# Patient Record
Sex: Female | Born: 1960 | Race: White | Hispanic: No | Smoking: Never smoker
Health system: Southern US, Community
[De-identification: ages and names within clinical notes are randomized; demographics above are authoritative.]

## PROBLEM LIST (undated history)

## (undated) HISTORY — PX: TONSILLECTOMY: SUR1361

## (undated) HISTORY — PX: APPENDECTOMY: SHX54

---

## 2004-02-29 ENCOUNTER — Emergency Department (HOSPITAL_COMMUNITY): Admission: EM | Admit: 2004-02-29 | Discharge: 2004-02-29 | Payer: Self-pay | Admitting: *Deleted

## 2005-11-27 ENCOUNTER — Ambulatory Visit (HOSPITAL_COMMUNITY): Admission: RE | Admit: 2005-11-27 | Discharge: 2005-11-27 | Payer: Self-pay | Admitting: Occupational Therapy

## 2007-01-12 ENCOUNTER — Emergency Department (HOSPITAL_COMMUNITY): Admission: EM | Admit: 2007-01-12 | Discharge: 2007-01-12 | Payer: Self-pay | Admitting: Emergency Medicine

## 2007-02-17 ENCOUNTER — Ambulatory Visit: Payer: Self-pay | Admitting: Orthopedic Surgery

## 2007-02-23 ENCOUNTER — Ambulatory Visit (HOSPITAL_COMMUNITY): Admission: RE | Admit: 2007-02-23 | Discharge: 2007-02-23 | Payer: Self-pay | Admitting: Orthopedic Surgery

## 2013-04-25 ENCOUNTER — Encounter: Payer: Self-pay | Admitting: Orthopedic Surgery

## 2013-05-07 ENCOUNTER — Encounter: Payer: Self-pay | Admitting: Orthopedic Surgery

## 2013-05-31 ENCOUNTER — Ambulatory Visit: Payer: Self-pay | Admitting: Orthopedic Surgery

## 2013-05-31 LAB — CBC WITH DIFFERENTIAL/PLATELET
Comment - H1-Com1: NORMAL
HCT: 41.9 % (ref 35.0–47.0)
HGB: 14 g/dL (ref 12.0–16.0)
MCH: 32.6 pg (ref 26.0–34.0)
MCHC: 33.5 g/dL (ref 32.0–36.0)
Monocytes: 9 %
Platelet: 348 10*3/uL (ref 150–440)
RBC: 4.31 10*6/uL (ref 3.80–5.20)
Segmented Neutrophils: 58 %
WBC: 8.7 10*3/uL (ref 3.6–11.0)

## 2013-06-06 ENCOUNTER — Encounter: Payer: Self-pay | Admitting: Orthopedic Surgery

## 2013-07-07 ENCOUNTER — Encounter: Payer: Self-pay | Admitting: Orthopedic Surgery

## 2013-08-07 ENCOUNTER — Encounter: Payer: Self-pay | Admitting: Orthopedic Surgery

## 2013-09-06 ENCOUNTER — Encounter: Payer: Self-pay | Admitting: Orthopedic Surgery

## 2013-11-17 ENCOUNTER — Ambulatory Visit: Payer: Self-pay | Admitting: Family Medicine

## 2014-01-18 ENCOUNTER — Encounter: Payer: Self-pay | Admitting: Orthopedic Surgery

## 2014-02-04 ENCOUNTER — Encounter: Payer: Self-pay | Admitting: Orthopedic Surgery

## 2014-06-25 ENCOUNTER — Encounter: Payer: Self-pay | Admitting: Orthopedic Surgery

## 2014-07-07 ENCOUNTER — Encounter: Payer: Self-pay | Admitting: Orthopedic Surgery

## 2014-08-07 ENCOUNTER — Encounter: Payer: Self-pay | Admitting: Orthopedic Surgery

## 2014-09-06 ENCOUNTER — Encounter: Payer: Self-pay | Admitting: Orthopedic Surgery

## 2015-02-24 IMAGING — CR RIGHT ELBOW - COMPLETE 3+ VIEW
1 series · 5 of 5 positions shown · non-contrast
Comparison: None.

CLINICAL DATA: Pain

EXAM:
RIGHT ELBOW - COMPLETE 3+ VIEW

[Series 1: oblique · 0.17mm/px · 5 of 5 slices shown]
[im 1/5]
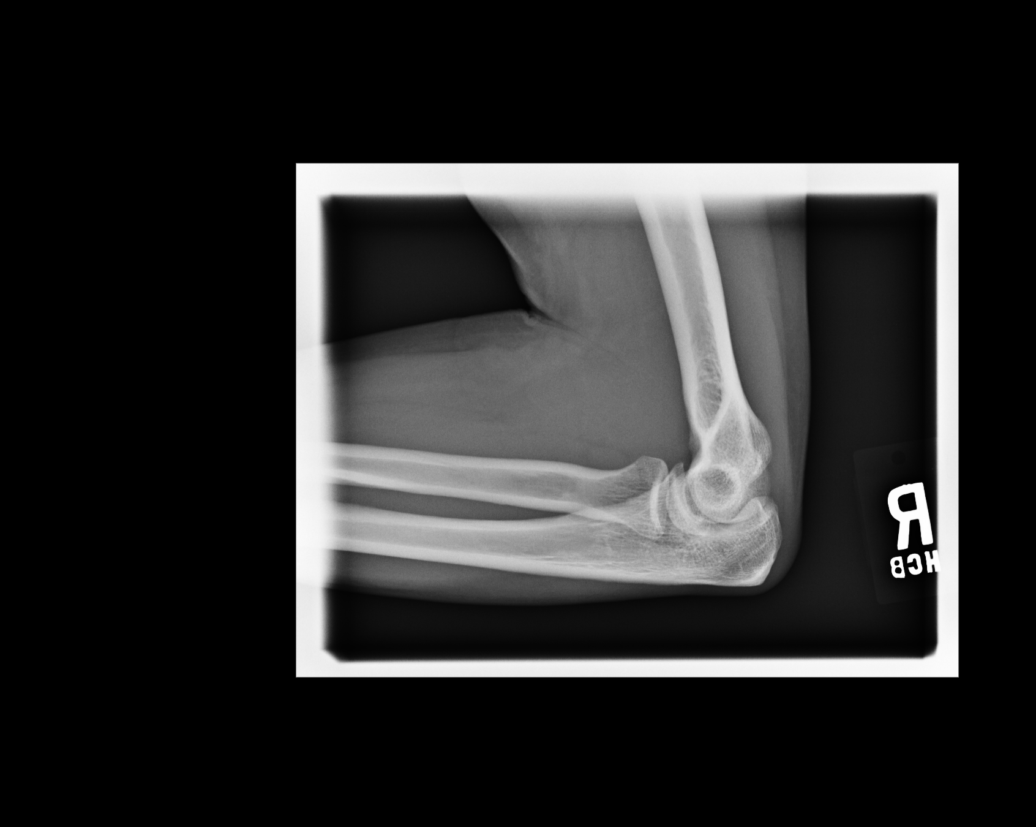
[im 2/5]
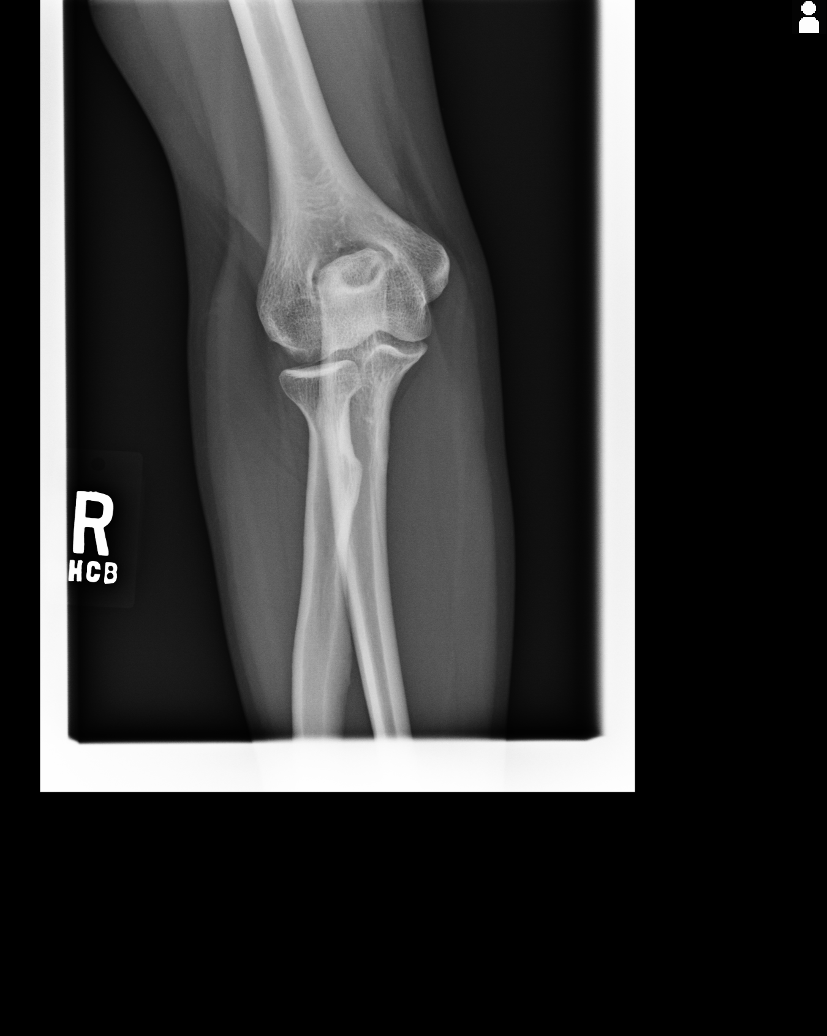
[im 3/5]
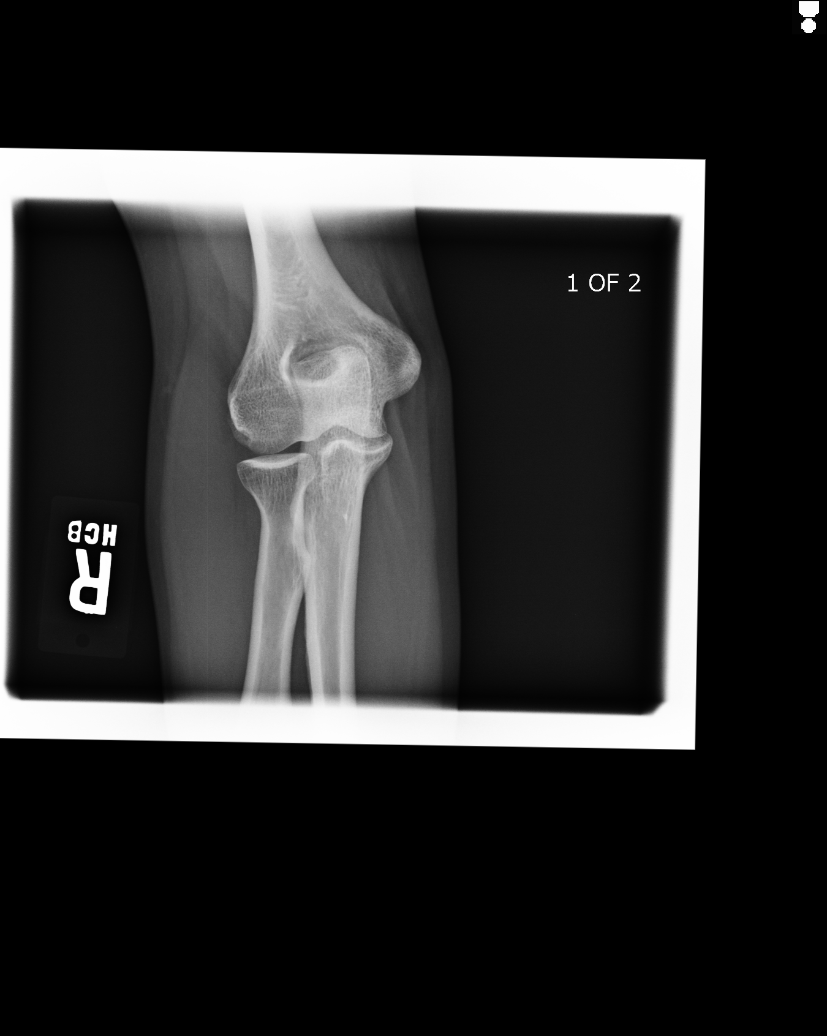
[im 4/5]
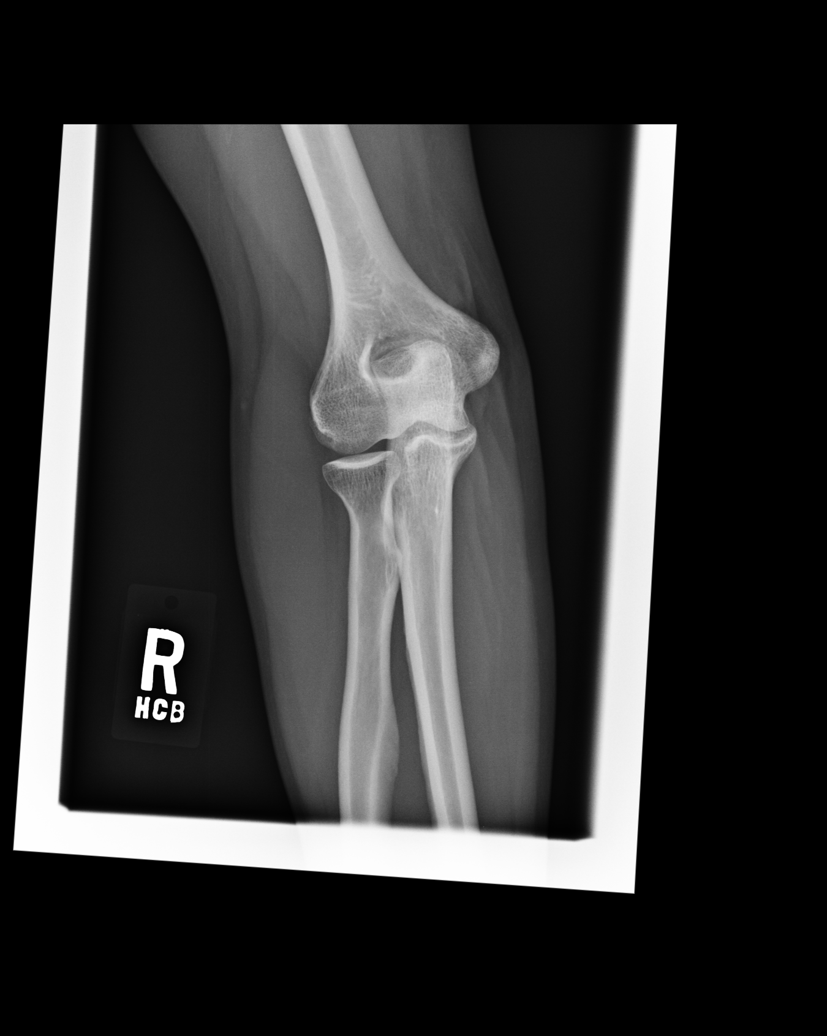
[im 5/5]
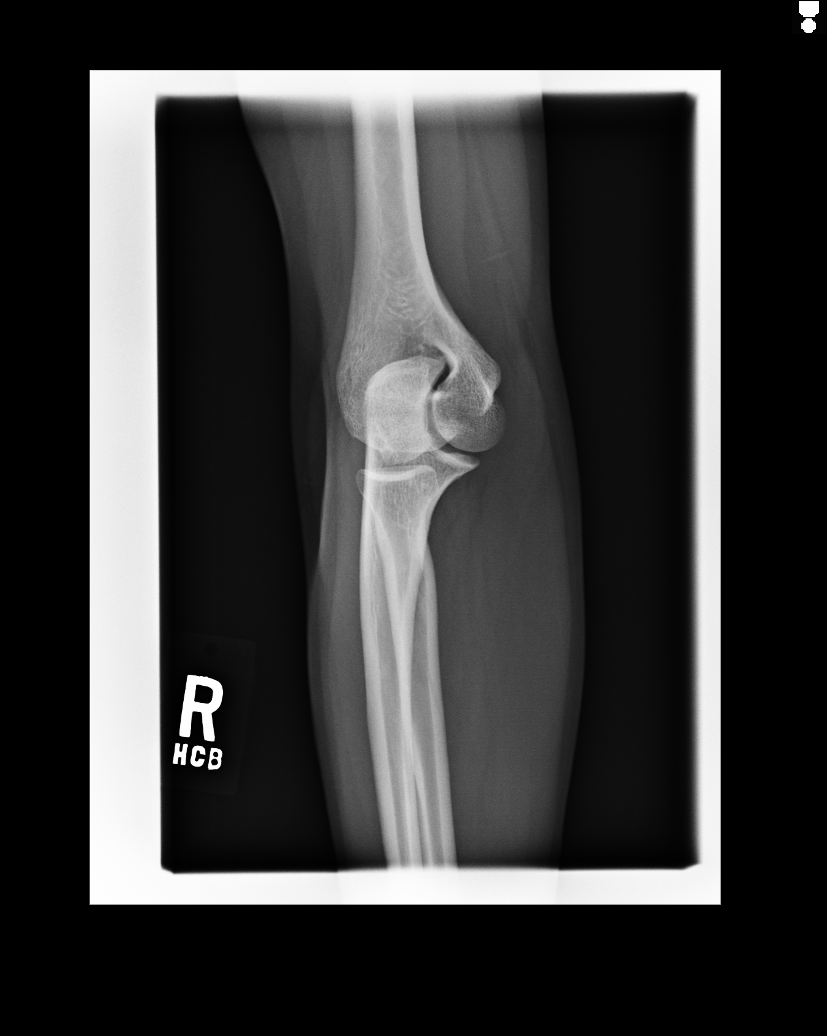

[5 of 5 positions shown; findings below may reference images not displayed]

FINDINGS: Frontal, lateral, and bilateral oblique views were obtained. There
is no fracture, dislocation, or effusion. Joint spaces appear
intact. No erosive change.
IMPRESSION: No abnormality noted.

## 2015-03-05 ENCOUNTER — Encounter: Admit: 2015-03-05 | Disposition: A | Discharge status: Home / Self Care | Payer: Self-pay

## 2015-03-08 ENCOUNTER — Encounter: Admit: 2015-03-08 | Disposition: A | Discharge status: Home / Self Care | Payer: Self-pay

## 2015-04-10 ENCOUNTER — Encounter: Payer: Self-pay | Admitting: Physical Therapy

## 2015-04-10 ENCOUNTER — Ambulatory Visit: Payer: Worker's Compensation | Attending: Neurology | Admitting: Physical Therapy

## 2015-04-10 DIAGNOSIS — M6281 Muscle weakness (generalized): Secondary | ICD-10-CM | POA: Diagnosis not present

## 2015-04-10 DIAGNOSIS — M545 Low back pain, unspecified: Secondary | ICD-10-CM

## 2015-04-10 NOTE — Therapy (Signed)
[place] [facility] [address] [place], [place], [address] Phone: [phone]   Fax:  [phone]  Physical Therapy Treatment  Patient Details  Name: Virginia Mckay MRN: [id number] Date of Birth: 1961/02/22 Referring Provider:  Wallace Going, MD  Encounter Date: 04/10/2015      PT End of Session - 04/10/15 1251    Visit Number 14   Number of Visits 21   Date for PT Re-Evaluation 05/30/15   PT Start Time 0821   PT Stop Time 0919   PT Time Calculation (min) 58 min   Activity Tolerance Patient tolerated treatment well;No increased pain;Patient limited by fatigue   Behavior During Therapy [facility] for tasks assessed/performed      No past medical history on file.  No past surgical history on file.  There were no vitals filed for this visit.  Visit Diagnosis:  Muscle weakness (generalized)  Midline low back pain without sciatica      Subjective Assessment - 04/10/15 1241    Subjective Pt. reports no pain but states she is deconditioned and easliy fatigued with increase activity.  Pt. has not participated with gym based ex. since last PT tx. session secondary to being busy with daily activities.  Pt. has returned to Dr. Gordy Clement with instruction to continue with PT and assigned tentative return to work date of 05/27/15.   Patient Stated Goals return to work   Currently in Pain? No/denies       OBJECTIVE:  There.ex.: see new HEP handouts/ reviewed B UE gym based Nautilus ex. Program.  Standing Nautilus: lat. Pull downs 50#/ tricep ext. 30#/ scap. Retraction 50#/ press-ups 30#/ chest press 30#  10x2 each ex. TG B knee flexion 30x/ single leg squats 20x each L/R and heel raises 20x each.  Scifit L8 12 min. B UE/LE (no charge).  Encouraged pt. To continue with aquatic ex. Program/ attend gym on a regular basis.      Pt response for medical necessity:  Noted generalized muscle fatigue, esp. In B UE/low back. Pt. Continues to progress with  strengthening and will benefit from continued skilled PT instruction with gym program focus.          PT Education - 04/10/15 1250    Education provided Yes   Education Details issued handout for gym based UE strengthening every other day.   Person(s) Educated Patient   Methods Explanation;Demonstration;Handout   Comprehension Verbalized understanding;Returned demonstration             PT Long Term Goals - 04/10/15 1533    PT LONG TERM GOAL #1   Title Pt. I with HEP to improve core strengthening to WNL to develop independent gym based/ aquatic ex. program.   Time 8   Period Weeks   Status Partially Met   PT LONG TERM GOAL #2   Title Pt. will decrease Oswestry to <30% for reduced self-reported lumbar spine disability.    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    Time 8   Period Weeks   Status On-going   PT LONG TERM GOAL #4   Title Pt. able to open aircraft window weighing up to 59# with no limitations safely.    Time 8   Period Weeks   Status On-going   PT LONG TERM GOAL #5  Title Pt. able to push/pull wheeled beverage cart up a 7.5 deg. incline with a force of 60# safely with no limitations to promote return to work.    Time 8   Status Partially Met               Plan - 04/10/15 1531    Pt will benefit from skilled therapeutic intervention in order to improve on the following deficits Improper body mechanics;Decreased strength;Decreased mobility;Pain;Decreased range of motion;Impaired UE functional use   PT Treatment/Interventions Aquatic Therapy;Functional mobility training;Patient/family education;Cryotherapy;Manual techniques;Therapeutic exercise   PT Next Visit Plan generalized strength training/ job demand lifting and pull requirements.     Consulted and Agree with Plan of Care Patient       Pura Spice, PT, DPT #  [id number]  Pura Spice 04/10/2015, 3:46 PM  [place] [facility] [address] [place], [place], [address] Phone: [phone]   Fax:  [phone]

## 2015-04-10 NOTE — Patient Instructions (Signed)
Issued HEP with resist shld lat pull downs/ resist elbow ext bil/ resist elbow flx/ resist shld retraction close elbow/ resist shld overhead press-ups/ resist chest press with horizontal focus/ knee squat with TG (all gym focused ex).

## 2015-04-17 ENCOUNTER — Ambulatory Visit: Payer: Self-pay | Admitting: Physical Therapy

## 2015-04-17 ENCOUNTER — Ambulatory Visit: Payer: Worker's Compensation | Admitting: Physical Therapy

## 2015-04-17 ENCOUNTER — Encounter: Payer: Self-pay | Admitting: Physical Therapy

## 2015-04-17 DIAGNOSIS — M6281 Muscle weakness (generalized): Secondary | ICD-10-CM

## 2015-04-17 DIAGNOSIS — M545 Low back pain, unspecified: Secondary | ICD-10-CM

## 2015-04-17 NOTE — Therapy (Signed)
[facility] [address] [place], [place], [address] Phone: [phone]   Fax:  [phone]  Physical Therapy Treatment  Patient Details  Name: Virginia Mckay MRN: [id number] Date of Birth: 11-Jul-1961 Referring Provider:  Wallace Going, MD  Encounter Date: 04/17/2015      PT End of Session - 04/17/15 0927    Visit Number 15   Number of Visits 21   Date for PT Re-Evaluation 05/30/15   PT Start Time 0813   PT Stop Time 0922   PT Time Calculation (min) 69 min   Activity Tolerance Patient tolerated treatment well;No increased pain;Patient limited by fatigue   Behavior During Therapy [facility] for tasks assessed/performed      History reviewed. No pertinent past medical history.  History reviewed. No pertinent past surgical history.  There were no vitals filed for this visit.  Visit Diagnosis:  Muscle weakness (generalized)  Midline low back pain without sciatica      Subjective Assessment - 04/17/15 0924    Subjective Pt. states she has participated with pool ex. program 4x since last PT visit.  Pt. reports improvement in overall endurance since starting pool and states she is very motivated to get her strength back/ return to work.  Pt. states R shoulder discomfort for the past few days and crepitus noted.    Patient Stated Goals return to work   Currently in Pain? No/denies       OBJECTIVE: Ther.ex.: Discussed Pool ex. Program. Standing Nautilus: lat. Pull downs 60#/ tricep ext. 40#/ scap. retraction 50#/ chest press 40# 10x2 each ex (increase 10# resistance with all Nautilus ex.)- good core muscle control/ upright posture.  TG B knee flexion 30x/ single leg squats 15x2x each L/R.  Waist to overhead repetitive lifting 20# box 6x (independent with minimal instruct/ cuing required).  Sled push/pull 120# 45 feet x 3. Scifit L7.5 15 min. B UE/LE (no charge).     Pt response for medical necessity: Generalized muscle  fatigue.  Pt. Continues to progress with strengthening and will benefit from continued skilled PT instruction with gym program focus.            PT Education - 04/17/15 0926    Education provided Yes   Education Details overhead lifting mechanics   Person(s) Educated Patient   Methods Explanation;Demonstration   Comprehension Verbalized understanding;Returned demonstration             PT Long Term Goals - 04/10/15 1533    PT LONG TERM GOAL #1   Title Pt. I with HEP to improve core strengthening to WNL to develop independent gym based/ aquatic ex. program.   Time 8   Period Weeks   Status Partially Met   PT LONG TERM GOAL #2   Title Pt. will decrease Oswestry to <30% for reduced self-reported lumbar spine disability.    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    Time 8   Period Weeks   Status On-going   PT LONG TERM GOAL #4   Title Pt. able to open aircraft window weighing up to 59# with no limitations safely.    Time 8   Period Weeks   Status On-going   PT LONG TERM GOAL #5   Title Pt. able to push/pull wheeled beverage cart up a 7.5  deg. incline with a force of 60# safely with no limitations to promote return to work.    Time 8   Status Partially Met               Plan - 04/17/15 0927    Clinical Impression Statement Marked increase in UE resisted strengthening on Nautlilus today (10# increase in all movements).  Pt. able to lift 20# box 6x from waist to overhead and push 60# sled over 100 feet with proper technique.    Pt will benefit from skilled therapeutic intervention in order to improve on the following deficits Improper body mechanics;Decreased strength;Decreased mobility;Pain;Decreased range of motion;Impaired UE functional use   Rehab Potential Good   PT Frequency 1x / week   PT Duration 8 weeks   PT Treatment/Interventions  Aquatic Therapy;Functional mobility training;Patient/family education;Cryotherapy;Manual techniques;Therapeutic exercise   PT Next Visit Plan generalized strength training/ job demand lifting and pull requirements.     Consulted and Agree with Plan of Care Patient        Problem List There are no active problems to display for this patient.  Pura Spice, PT, DPT # [phone]  04/17/2015, 9:30 AM  [place] [facility] [address] [place], [place], [address] Phone: [phone]   Fax:  [phone]

## 2015-04-24 ENCOUNTER — Encounter: Payer: Self-pay | Admitting: Physical Therapy

## 2015-04-24 ENCOUNTER — Ambulatory Visit: Payer: Worker's Compensation | Admitting: Physical Therapy

## 2015-04-24 DIAGNOSIS — M545 Low back pain, unspecified: Secondary | ICD-10-CM

## 2015-04-24 DIAGNOSIS — M6281 Muscle weakness (generalized): Secondary | ICD-10-CM

## 2015-04-24 NOTE — Therapy (Signed)
[facility] [address] [place], [place], [address] Phone: [phone]   Fax:  [phone]  Physical Therapy Treatment  Patient Details  Name: Virginia Mckay MRN: [id number] Date of Birth: 09-May-1961 Referring Provider:  Wallace Going, MD  Encounter Date: 04/24/2015      PT End of Session - 04/24/15 1002    Visit Number 16   Number of Visits 21   Date for PT Re-Evaluation 05/30/15   PT Start Time 0811   PT Stop Time 0910   PT Time Calculation (min) 59 min   Activity Tolerance Patient tolerated treatment well;No increased pain;Patient limited by fatigue   Behavior During Therapy [facility] for tasks assessed/performed      No past medical history on file.  No past surgical history on file.  There were no vitals filed for this visit.  Visit Diagnosis:  Muscle weakness (generalized)  Midline low back pain without sciatica      Subjective Assessment - 04/24/15 0824    Subjective Pt. states she is doing better and getting closer to returning to work.  Pt. went to [place] recently to get finger printing/ start process to return to work.  Pt. goes to [place] tomorrow for passport authorization tomorrow.     Limitations Standing;Walking;Other (comment)   Patient Stated Goals return to work   Currently in Pain? No/denies      - discuss swimming movements in pool.  Pt. Reports difficulty with R shoulder during freestyle mvmt. As compared to L shoulder.  Numbness reported in R UE/hand during swimming.  Pt. Has MD f/u with Dr. Gordy Clement on June 2nd to discuss/ get released to return to work.    QuickDASH: 45.5%     LEFS: 45 out of 80.     OBJECTIVE: Ther.ex.:  Scifit L7.5 15+ min. B UE/LE (no charge). Standing Nautilus: lat. Pull downs 60#/ unilateral scap. Retraction 60#/ tricep ext. 40#/ bilateral scap. Retraction with handles 60#/ chest press with wand 40# 25x each ex. - good core muscle control/ upright posture (no verbal cuing required). TG  (20#) B knee flexion 30x with shoulder flexion/ single leg squats 15x2 each L/R. Waist to overhead repetitive lifting 25# box 1x (independent with minimal instruct/ cuing required). Sled push/pull 120# 45 feet x 3.         Pt response for medical necessity: Generalized muscle fatigue. No increase c/o pain and marked improvement noted with squats/ UE strengthening.  .          PT Education - 04/24/15 1001    Education provided Yes   Education Details gym based ex. program.   Person(s) Educated Patient   Methods Explanation;Demonstration   Comprehension Verbalized understanding;Returned demonstration           PT Long Term Goals - 04/10/15 1533    PT LONG TERM GOAL #1   Title Pt. I with HEP to improve core strengthening to WNL to develop independent gym based/ aquatic ex. program.   Time 8   Period Weeks   Status Partially Met   PT LONG TERM GOAL #2   Title Pt. will decrease Oswestry to <30% for reduced self-reported lumbar spine disability.    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    Time 8   Period Weeks   Status On-going  PT LONG TERM GOAL #4   Title Pt. able to open aircraft window weighing up to 59# with no limitations safely.    Time 8   Period Weeks   Status On-going   PT LONG TERM GOAL #5   Title Pt. able to push/pull wheeled beverage cart up a 7.5 deg. incline with a force of 60# safely with no limitations to promote return to work.    Time 8   Status Partially Met       Problem List There are no active problems to display for this patient.  Pura Spice, PT, DPT # [phone]   04/24/2015, 10:04 AM  [place] [facility] [address] [place], [place], [address] Phone: [phone]   Fax:  [phone]

## 2015-05-01 ENCOUNTER — Encounter: Payer: Self-pay | Admitting: Physical Therapy

## 2015-05-01 ENCOUNTER — Ambulatory Visit: Payer: Worker's Compensation | Admitting: Physical Therapy

## 2015-05-01 DIAGNOSIS — M545 Low back pain, unspecified: Secondary | ICD-10-CM

## 2015-05-01 DIAGNOSIS — M6281 Muscle weakness (generalized): Secondary | ICD-10-CM

## 2015-05-01 NOTE — Therapy (Signed)
[place] [address] [place], [place], [address] Phone: [phone]   Fax:  [phone]  Physical Therapy Treatment  Patient Details  Name: Virginia Mckay MRN: [id number] Date of Birth: [birth year] Referring Provider:  Wallace Going, MD  Encounter Date: 05/01/2015      PT End of Session - 05/01/15 1224    Visit Number 17   Number of Visits 21   Date for PT Re-Evaluation 05/30/15   PT Start Time 0818   PT Stop Time 0921   PT Time Calculation (min) 63 min   Activity Tolerance Patient tolerated treatment well;No increased pain;Patient limited by fatigue   Behavior During Therapy [facility] for tasks assessed/performed      No past medical history on file.  No past surgical history on file.  There were no vitals filed for this visit.  Visit Diagnosis:  Muscle weakness (generalized)  Midline low back pain without sciatica      Subjective Assessment - 05/01/15 0828    Subjective Pt. states L hand is completely numb and has same issues during freestyle movement in pool every morning.  Pt. excited about returning to work and feels she is ready.     Limitations Standing;Walking;Other (comment)   Patient Stated Goals return to work        OBJECTIVE: Ther.ex.:  B UBE 3.5 min. F/b (2 bars)- consistent cadence (fatigue noted).  TG (20#) B knee flexion 30x/ toe in/ toe out 15 each/ single leg squats 15x2 each L/R. Standing Nautilus (handles): lat. Pull downs 60# (seated)/ unilateral scap. Retraction 60#/ tricep ext. 40#/ 40# B sh. Adduction/ bilateral scap. Retraction with handles 60# 25x each.  Reverse BOSU squats 30x.  Seated green exercise ball 2# UE ex. Scifit L9 15+ min. B UE/LE (no charge).     Pt response for medical necessity: Generalized muscle fatigue. No increase c/o pain and marked improvement noted with squats/ UE strengthening. .           PT Long Term Goals - 04/10/15 1533    PT LONG TERM GOAL  #1   Title Pt. I with HEP to improve core strengthening to WNL to develop independent gym based/ aquatic ex. program.   Time 8   Period Weeks   Status Partially Met   PT LONG TERM GOAL #2   Title Pt. will decrease Oswestry to <30% for reduced self-reported lumbar spine disability.    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    Time 8   Period Weeks   Status On-going   PT LONG TERM GOAL #4   Title Pt. able to open aircraft window weighing up to 59# with no limitations safely.    Time 8   Period Weeks   Status On-going   PT LONG TERM GOAL #5   Title Pt. able to push/pull wheeled beverage cart up a 7.5 deg. incline with a force of 60# safely with no limitations to promote return to work.    Time 8   Status Partially Met            Plan - 05/01/15 1225    Clinical Impression Statement Good overall endurance with ther.ex. today with no rest breaks taken.  Good B UE AROM with overhead Nautilus resisted ex.  Great upright posture with seated  ball ex.     Pt will benefit from skilled therapeutic intervention in order to improve on the following deficits Improper body mechanics;Decreased strength;Decreased mobility;Pain;Decreased range of motion;Impaired UE functional use   Rehab Potential Good   PT Frequency 1x / week   PT Duration 8 weeks   PT Treatment/Interventions Aquatic Therapy;Functional mobility training;Patient/family education;Cryotherapy;Manual techniques;Therapeutic exercise   PT Next Visit Plan generalized strength training/ job demand lifting and pull requirements.     PT Home Exercise Plan continue with personal trainer at [employer] and aquatic ex.    Consulted and Agree with Plan of Care Patient        Problem List There are no active problems to display for this patient.   Pura Spice, PT, DPT # [phone]   05/01/2015, 12:29 PM  [facility] [address] [place], [place], [address] Phone: [phone]   Fax:  [phone]

## 2015-05-08 ENCOUNTER — Ambulatory Visit: Payer: Worker's Compensation | Attending: Neurology

## 2015-05-08 ENCOUNTER — Encounter: Payer: Self-pay | Admitting: Physical Therapy

## 2015-05-08 DIAGNOSIS — M6281 Muscle weakness (generalized): Secondary | ICD-10-CM | POA: Diagnosis present

## 2015-05-08 DIAGNOSIS — M545 Low back pain, unspecified: Secondary | ICD-10-CM

## 2015-05-08 NOTE — Therapy (Signed)
[facility] [address] [place], [place], [address] Phone: [phone]   Fax:  [phone]  Physical Therapy Treatment  Patient Details  Name: Virginia Mckay MRN: [id number] Date of Birth: Sep 10, 1961 Referring Provider:  Wallace Going, MD  Encounter Date: 05/08/2015      PT End of Session - 05/08/15 1026    Visit Number 18   Number of Visits 21   Date for PT Re-Evaluation 05/30/15   PT Start Time 1000   PT Stop Time 1045   PT Time Calculation (min) 45 min   Activity Tolerance Patient tolerated treatment well;No increased pain;Patient limited by fatigue   Behavior During Therapy [facility] for tasks assessed/performed      History reviewed. No pertinent past medical history.  History reviewed. No pertinent past surgical history.  There were no vitals filed for this visit.  Visit Diagnosis:  Muscle weakness (generalized)  Midline low back pain without sciatica      Subjective Assessment - 05/08/15 1021    Subjective Pt reports that she is returning back to her physician tomorrow and is hoping he will release her to work. Pt is excited to return to work. She is still noting L hand numbness during swimming and reports persistent low back pain.    Limitations Standing;Walking   Patient Stated Goals return to work   Currently in Pain? Yes   Pain Score 6    Pain Location Back   Pain Orientation Lower       OBJECTIVE:  Extensive discussion with patient regarding plan of care, home exercise program, and return to work. Pt assisted in completing modified ODI. Goals updated with patient. Ther.ex.: Bilateral hand bike 4 min forward and backward (2 bars)- consistent cadence (fatigue noted). Standing Nautilus (bar): lat. pull downs 30# (standing) x 15, tricep press down 30# (standing) x 15, chest press 30# x 10, rows 30# x 10; Total gym squats x 15, single leg squats 2 x 10 bilateral; Generalized muscle fatigue.  No increase c/o pain. Pt  requires cues as well as weight adjustment to complete all exercises.                          PT Education - 05/08/15 1032    Education provided Yes   Education Details Reviewed goals, ensured independence with HEP   Person(s) Educated Patient   Methods Explanation   Comprehension Verbalized understanding             PT Long Term Goals - 05/08/15 1007    PT LONG TERM GOAL #1   Title Pt. I with HEP to improve core strengthening to WNL to develop independent gym based/ aquatic ex. program.   Time 8   Period Weeks   Status Achieved  Pt verbally confirmed   PT LONG TERM GOAL #2   Title Pt. will decrease Oswestry to <30% for reduced self-reported lumbar spine disability.    Baseline 52% on 05/08/15   Time 8   Period Weeks   Status On-going  Clarification provided for questions that are not well understood   PT LONG TERM GOAL #3   Title Pt. able to open a hinged aircraft door weighing 90# safely to promote return to work.    Time 8   Period Weeks   Status Achieved  Based on prior session. Pt confirms she has met goal   PT LONG TERM GOAL #4   Title Pt. able  to open aircraft window weighing up to 59# with no limitations safely.    Time 8   Period Weeks   Status Achieved  Based on prior session. Pt confirms she has met goal   PT LONG TERM GOAL #5   Title Pt. able to push/pull wheeled beverage cart up a 7.5 deg. incline with a force of 60# safely with no limitations to promote return to work.    Time 8   Period Weeks   Status Achieved  Based on prior session. Pt confirms she has met goal               Plan - 05/08/15 1033    Clinical Impression Statement Pt demonstrates ability to complete all exercises as instructed during today's session. She verbally confirms having met all of her goals with the exception of scoring a 52% (goal is <30%) on the modified ODI. Pt is ready and eager to return to work at this time. She is hoping that her physician  will release her at her follow-up appointment tomorrow. Pt confirms full independence with home exercise program and has no further deficits which need to be addressed by physical therapy.   Pt will benefit from skilled therapeutic intervention in order to improve on the following deficits --   Rehab Potential --   PT Frequency --   PT Duration --   PT Treatment/Interventions --   PT Next Visit Plan discharge today   PT Home Exercise Plan continue with personal trainer at [employer] and aquatic ex.    Consulted and Agree with Plan of Care Patient     PHYSICAL THERAPY DISCHARGE SUMMARY  Visits from Start of Care: 18  Current functional level related to goals / functional outcomes: All goals met with the exception of modified ODI: 52%. See above goals.    Remaining deficits: Modified ODI: 52%, low back pain   Education / Equipment: HEP, continue independent exercise routine at local gym/pool Plan: Patient agrees to discharge.  Patient goals were met. Patient is being discharged due to meeting the stated rehab goals.  Pt is pleased with her current functional level. ?????         Thank you for this referral. Please call with any questions or concerns.   Problem List There are no active problems to display for this patient.    Phillips Grout PT, DPT   Virginia Mckay 05/08/2015, 12:38 PM  [facility] [address] [place], [place], [address] Phone: [phone]   Fax:  [phone]

## 2015-07-19 ENCOUNTER — Other Ambulatory Visit: Payer: Self-pay | Admitting: Family Medicine

## 2015-07-19 DIAGNOSIS — R928 Other abnormal and inconclusive findings on diagnostic imaging of breast: Secondary | ICD-10-CM

## 2015-09-07 ENCOUNTER — Ambulatory Visit
Admission: EM | Admit: 2015-09-07 | Discharge: 2015-09-07 | Disposition: A | Discharge status: Home / Self Care | Payer: Worker's Compensation | Attending: Internal Medicine | Admitting: Internal Medicine

## 2015-09-07 DIAGNOSIS — L509 Urticaria, unspecified: Secondary | ICD-10-CM

## 2015-09-07 MED ORDER — CETIRIZINE HCL 10 MG PO TABS: 10.0000 mg | ORAL_TABLET | Freq: Every day | ORAL | Status: DC

## 2015-09-07 MED ORDER — PREDNISONE 20 MG PO TABS: 20.0000 mg | ORAL_TABLET | Freq: Every day | ORAL | Status: DC

## 2015-09-07 NOTE — ED Provider Notes (Signed)
CSN: [id number]     Arrival date & time 09/07/15  1338 History   First MD Initiated Contact with Patient 09/07/15 1456     Chief Complaint  Patient presents with  . Pruritis   HPI  Patient is a 54 year old lady with past medical history notable for allergy and urticaria. 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 She had difficulty with cough and nausea, but did not report vomiting, dyspnea, or wheezing. No syncope. She is followed by some [facility] specialists, including a dermatologist who is now practicing in [place] for her allergic symptoms.  Past medical history: Allergies/urticaria Past surgical history: Shoulder surgery Family history: Father is diabetic Social History works for [employer] as a Psychologist, sport and exercise Topics  . Smoking status: Never Smoker   . Smokeless tobacco: None  . Alcohol Use: Yes     Comment: rarely    Review of Systems  All other systems reviewed and are negative.   Allergies  Sulfa antibiotics; Iodine; Lyrica; Neosporin; and Neurontin  Home Medications   Prior to Admission medications   Medication Sig Start Date End Date Taking? Authorizing Provider  acyclovir (ZOVIRAX) 800 MG tablet Take 800 mg by mouth 5 (five) times daily.    Historical Provider, MD  cetirizine (ZYRTEC) 10 MG tablet Take 1 tablet (10 mg total) by mouth daily. 09/07/15   Eustace Moore, MD  diazepam (VALIUM) 5 MG tablet Take 5 mg by mouth every 6 (six) hours as needed for anxiety.    Historical Provider, MD  predniSONE (DELTASONE) 20 MG tablet Take 1 tablet (20 mg total) by mouth daily. 3 tabs qd x 3d then 2 tabs qd x 3d then 1 tab qd x 3d then 0.5 tab qd x 4d then stop. 09/07/15   Eustace Moore, MD  promethazine (PHENERGAN) 12.5 MG tablet Take 12.5 mg by mouth every 6 (six) hours as needed for nausea or vomiting.    Historical Provider, MD  SUMAtriptan (IMITREX) 100 MG  tablet Take 100 mg by mouth every 2 (two) hours as needed for migraine. May repeat in 2 hours if headache persists or recurs.    Historical Provider, MD  topiramate (TOPAMAX) 100 MG tablet Take 100 mg by mouth 2 (two) times daily.    Historical Provider, MD   Meds Ordered and Administered this Visit  Medications - No data to display  BP 105/74 mmHg  Pulse 61  Temp(Src) 97.9 F (36.6 C) (Oral)  Ht  (1.6 m)  Wt 128 lb (58.06 kg)  BMI 22.68 kg/m2  SpO2 100%  LMP 08/08/2015 No data found.   Physical Exam  Constitutional: She is oriented to person, place, and time. No distress.  Alert, nicely groomed  HENT:  Head: Atraumatic.  Eyes:  Conjugate gaze, no eye redness/drainage  Neck: Neck supple.  Cardiovascular: Normal rate and regular rhythm.   Pulmonary/Chest: No respiratory distress. She has no wheezes. She has no rales.  Lungs clear, symmetric breath sounds  Abdominal: She exhibits no distension.  Musculoskeletal: Normal range of motion.  No leg swelling  Neurological: She is alert and oriented to person, place, and time.  Skin: Skin is warm and dry.  No cyanosis Diffuse patchy erythema over both arms and anterior chest, patient relates that she's beginning to have some itching between her legs as well. No facial swelling evident today.  Nursing note and  vitals reviewed.   ED Course  Procedures (including critical care time)    MDM   1. Urticaria    Patient is concerned that the trigger for this episode of urticaria was getting new uniform, particularly to pink shirts date of 100% cotton.  She discussed washing the shirts before wearing again a couple of times, to see if fabric additives/sizing are contributing. If washing the shirts allows her to wear them again without triggering urticaria, then I think it is fine for her to return to work full duty.  Otherwise she will need to wear alternate clothing pending further evaluation.. Recommendation to follow-up with  dermatology if symptoms have not subsided in 2 weeks.  Prescriptions for prednisone taper and Zyrtec were printed. Medical status questionnaire was completed, and 2 copies were given to the patient. One copies for the patient, one is for her to deliver to HR.   Eustace Moore, MD 09/07/15 [phone]

## 2015-09-07 NOTE — ED Notes (Signed)
Itching all over upper body since the 27th of September because of new uniform given out by [employer].

## 2015-09-07 NOTE — Discharge Instructions (Signed)
Prescriptions for cetirizine (zyrtec, an antihistamine) and prednisone (steroid) taper were printed. Medical Status Questionnaire completed. Follow up dermatology to guide further management if symptoms do not subside in 2 weeks.

## 2015-09-12 ENCOUNTER — Encounter: Payer: Self-pay | Admitting: Emergency Medicine

## 2015-09-12 ENCOUNTER — Ambulatory Visit
Admission: EM | Admit: 2015-09-12 | Discharge: 2015-09-12 | Disposition: A | Discharge status: Home / Self Care | Payer: Worker's Compensation | Attending: Family Medicine | Admitting: Family Medicine

## 2015-09-12 DIAGNOSIS — L509 Urticaria, unspecified: Secondary | ICD-10-CM

## 2015-09-12 DIAGNOSIS — T50995D Adverse effect of other drugs, medicaments and biological substances, subsequent encounter: Secondary | ICD-10-CM

## 2015-09-12 MED ORDER — RANITIDINE HCL 150 MG PO CAPS: 150.0000 mg | ORAL_CAPSULE | Freq: Two times a day (BID) | ORAL | Status: AC

## 2015-09-12 MED ORDER — CETIRIZINE HCL 10 MG PO TABS: 10.0000 mg | ORAL_TABLET | Freq: Every day | ORAL | Status: AC

## 2015-09-12 MED ORDER — PREDNISONE 10 MG (21) PO TBPK: ORAL_TABLET | ORAL | Status: AC

## 2015-09-12 MED ORDER — METHYLPREDNISOLONE SODIUM SUCC 125 MG IJ SOLR
125.0000 mg | Freq: Once | INTRAMUSCULAR | Status: AC
  Administered 2015-09-12: 125 mg via INTRAMUSCULAR

## 2015-09-12 MED ORDER — LORATADINE 10 MG PO TABS: 10.0000 mg | ORAL_TABLET | Freq: Every day | ORAL | Status: AC

## 2015-09-12 NOTE — Discharge Instructions (Signed)
Hives °Hives are itchy, red, puffy (swollen) areas of the skin. Hives can change in size and location on your body. Hives can come and go for hours, days, or weeks. Hives do not spread from person to person (noncontagious). Scratching, exercise, and stress can make your hives worse. °HOME CARE °· Avoid things that cause your hives (triggers). °· Take antihistamine medicines as told by your doctor. Do not drive while taking an antihistamine. °· Take any other medicines for itching as told by your doctor. °· Wear loose-fitting clothing. °· Keep all doctor visits as told. °GET HELP RIGHT AWAY IF:  °· You have a fever. °· Your tongue or lips are puffy. °· You have trouble breathing or swallowing. °· You feel tightness in the throat or chest. °· You have belly (abdominal) pain. °· You have lasting or severe itching that is not helped by medicine. °· You have painful or puffy joints. °These problems may be the first sign of a life-threatening allergic reaction. Call your local emergency services (911 in [place] °MAKE SURE YOU:  °· Understand these instructions. °· Will watch your condition. °· Will get help right away if you are not doing well or get worse. °  °This information is not intended to replace advice given to you by your health care provider. Make sure you discuss any questions you have with your health care provider. °  °Document Released: 09/01/2008 Document Revised: 05/24/2012 Document Reviewed: 02/16/2012 °Elsevier Interactive Patient Education ©2016 [employer] ° °

## 2015-09-12 NOTE — ED Provider Notes (Signed)
CSN: [id number]     Arrival date & time 09/12/15  0917 History   First MD Initiated Contact with Patient 09/12/15 0945     Chief Complaint  Patient presents with  . Urticaria   (Consider location/radiation/quality/duration/timing/severity/associated sxs/prior Treatment) Patient is a 54 y.o. female presenting with urticaria. No language interpreter was used.  Urticaria This is a recurrent problem. The current episode started more than 1 week ago. The problem occurs every several days. The problem has been rapidly improving. Pertinent negatives include no chest pain, no abdominal pain, no headaches and no shortness of breath. Nothing aggravates the symptoms. Nothing relieves the symptoms. Treatments tried: Zyrtec and no other 6 day prednisone taper. The treatment provided mild relief.      Patient reports being's seen by Dr. Dayton Scrape on Saturday for allergic reaction. 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 By the time she got to [place] and took a nap her face was swollen and she felt miserable. She has numerous pictures showing the changes of her face her eyes etc. patient strongly believes that it is the the new uniforms that there were given that caused the problem.Marland Kitchen She was told that [employer] also had problems and that sometimes these new uniforms have formaldehyde or other chemicals to allow the pressing and folding to be done. 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 I have explained to her several times that I cannot say what caused her urticaria but what we can try to do is to treat it and get her referred to a dermatologist/or allergist for her to be evaluated. She states that the prednisone that was given to her by Dr. Dayton Scrape doesn't seem to be working or effective and that on Sunday she had to have  had EMS come out to rescue her. She tried a fly and wore on the new scarfs and only did her face swell she started getting short of breath and had difficulty breathing at that time. According to her EMS started an IV and gave her 3 different IV medications and then some by mouth Benadryl to take since she did not live that far from home. She also states that while she's not had the shortness of breath anymore she's had increased congestion postnasal drainage and tremendous mild itching and this redness and swelling reoccurring. She is very frustrated and very upset  Excessive time spent talking the patient.   History reviewed. No pertinent past medical history. Past Surgical History  Procedure Laterality Date  . Tonsillectomy    . Appendectomy     No family history on file. Social History  Substance Use Topics  . Smoking status: Never Smoker   . Smokeless tobacco: None  . Alcohol Use: Yes     Comment: rarely   OB History    No data available     Review of Systems  Constitutional: Positive for activity change. Negative for fatigue.  HENT: Positive for postnasal drip and rhinorrhea.   Eyes: Positive for redness and itching.  Respiratory: Negative for shortness of breath.        She has has some shortness prep and not any today  Cardiovascular: Negative for chest pain.  Gastrointestinal: Negative for abdominal pain.  Skin: Positive for rash.  Allergic/Immunologic: Positive for environmental allergies.  Neurological: Negative for headaches.  Psychiatric/Behavioral: The patient is nervous/anxious.   All other systems reviewed and are negative.   Allergies  Sulfa antibiotics; Iodine; Lyrica; Neosporin; and Neurontin  Home Medications   Prior to Admission medications   Medication Sig Start Date End Date Taking? Authorizing Provider  diazepam (VALIUM) 5 MG tablet Take 5 mg by mouth every 6 (six) hours as needed for anxiety.   Yes Historical Provider, MD  acyclovir (ZOVIRAX) 800  MG tablet Take 800 mg by mouth 5 (five) times daily.    Historical Provider, MD  cetirizine (ZYRTEC) 10 MG tablet Take 1 tablet (10 mg total) by mouth daily. If needed at night for itching not relieved by Claritin in the morning. 09/12/15   Hassan Rowan, MD  loratadine (CLARITIN) 10 MG tablet Take 1 tablet (10 mg total) by mouth daily. Take 1 tablet in the morning. Would recommend take for at least a month. 09/12/15   Hassan Rowan, MD  predniSONE (STERAPRED UNI-PAK 21 TAB) 10 MG (21) TBPK tablet 6 tabs day 1 and 2, 5 tabs day 3 and 4, 4 tabs day 5 and 6, 3 tabs day 7 and 8, 2 tabs day 9 and 10, 1 tab day 11 and 12. Take orally 09/12/15   Hassan Rowan, MD  promethazine (PHENERGAN) 12.5 MG tablet Take 12.5 mg by mouth every 6 (six) hours as needed for nausea or vomiting.    Historical Provider, MD  ranitidine (ZANTAC) 150 MG capsule Take 1 capsule (150 mg total) by mouth 2 (two) times daily. For least a month. 09/12/15   Hassan Rowan, MD  SUMAtriptan (IMITREX) 100 MG tablet Take 100 mg by mouth every 2 (two) hours as needed for migraine. May repeat in 2 hours if headache persists or recurs.    Historical Provider, MD  topiramate (TOPAMAX) 100 MG tablet Take 100 mg by mouth 2 (two) times daily.    Historical Provider, MD   Meds Ordered and Administered this Visit   Medications  methylPREDNISolone sodium succinate (SOLU-MEDROL) 125 mg/2 mL injection 125 mg (not administered)    BP 127/67 mmHg  Pulse 58  Temp(Src) 97.8 F (36.6 C) (Tympanic)  Resp 16  Ht  (1.6 m)  Wt 128 lb (58.06 kg)  BMI 22.68 kg/m2  SpO2 100%  LMP 08/08/2015 No data found.   Physical Exam  Constitutional: She is oriented to person, place, and time. She appears well-developed and well-nourished.  HENT:  Head: Normocephalic and atraumatic.  Eyes: Conjunctivae are normal. Pupils are equal, round, and reactive to light.  Neck: Normal range of motion. Neck supple. No tracheal deviation present.  Cardiovascular: Normal rate,  regular rhythm and normal heart sounds.   No murmur heard. Pulmonary/Chest: Effort normal and breath sounds normal. No respiratory distress. She has no wheezes.  Musculoskeletal: Normal range of motion.  Neurological: She is alert and oriented to person, place, and time.  Skin: Rash noted. There is erythema.  IV bruises present on the arm she does have some redness of the arm but no actual hives at this time. The patient's acne scratching while in the examining room  Psychiatric: She has a normal mood and affect. Her behavior is normal.  Vitals reviewed.   ED Course  Procedures (including critical care time)  Labs Review Labs Reviewed - No data to display  Imaging Review No results found.   Visual Acuity Review  Right Eye Distance:   Left Eye Distance:   Bilateral Distance:    Right Eye Near:  Left Eye Near:    Bilateral Near:         MDM   1. Urticaria   2. Allergic reaction to dye, subsequent encounter     Urticaria with recurrent flareup. Suspected etiology new uniforms. 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 Strong suggest we'll give her Solu-Medrol 125 here IM, I will switch his prednisone to 12 day taper course, place her on Claritin to take in the morning and have her use Zyrtec at night if needed and also placed on Zantac 150 twice a day and wonders H1 and H2 blockers probably would need to be used for at least a month. Also since this is been a recurrent problem and because nature of the problem I think patient needs to see a dermatologist/allergist and needs to be cleared by them before she flies. We'll give her a week note to be out of work and expected to be seen by the specialist before then. Patient states she does have a dermatologist in [place] she is trying to see and if that's okay with a workers comp is fine with  me    Hassan Rowan, MD 09/12/15 1056

## 2015-09-12 NOTE — ED Notes (Addendum)
Workers Comp , seen on Saturday for hives. EMS can to her job on Sunday for flare up . No better
